# Patient Record
Sex: Male | Born: 1950 | Race: White
Health system: Northeastern US, Academic
[De-identification: ages and names within clinical notes are randomized; demographics above are authoritative.]

---

## 2017-10-20 ENCOUNTER — Ambulatory Visit
Admission: AD | Admit: 2017-10-20 | Discharge: 2017-10-20 | Disposition: A | Discharge status: Home / Self Care | Payer: Medicare (Managed Care) | Source: Ambulatory Visit | Attending: Internal Medicine | Admitting: Internal Medicine

## 2017-10-20 DIAGNOSIS — J208 Acute bronchitis due to other specified organisms: Secondary | ICD-10-CM

## 2017-10-20 MED ORDER — GUAIFENESIN-CODEINE 100-10 MG/5ML PO SYRP *I*
5.0000 mL | ORAL_SOLUTION | Freq: Three times a day (TID) | ORAL | 0 refills | Status: AC | PRN
Start: 2017-10-20 — End: ?

## 2017-10-20 NOTE — ED Triage Notes (Signed)
Pt states he has had a cough for four days and the cough last night worsened. He states he was having coughing fits last night and his wife was worried that he could not catch his breath. He states that the cough is productive for green sputum. His doctor called him in a script for benzonatate however he is not experienced any relief from it        Triage Note   Martin JerseyKelly Veretta Sabourin, RN

## 2017-10-20 NOTE — Discharge Instructions (Signed)
Use over the counter Robitussin DM (or generic) for cough during the daytime.    Do not drive after taking codeine cough syrup

## 2017-10-20 NOTE — UC Provider Note (Signed)
History     Chief Complaint   Patient presents with    Cough     Pt states he has had a cough for four days and the cough last night worsened. He states he was having coughing fits last night and his wife was worried that he could not catch his breath. He states that the cough is productive for green sputum. His doctor called him in a script for benzonatate however he is not experienced any relief from it      66 yo M with cough; about 4 days, no other symptoms. Has some SOB only with cough. Keeping him up at night. rx for tessalon but took twice with no improvement. Took mucinex which allowed him to bring up some green mucus. No congestion. No lung problems or inhalers. Nonsmoker. No f/c or chest pain. No other acute meds. Does take lisin 5 for HTN        History provided by:  Patient      Medical/Surgical/Family History     History reviewed. No pertinent past medical history.     There is no problem list on file for this patient.           History reviewed. No pertinent surgical history.  History reviewed. No pertinent family history.       Social History   Substance Use Topics    Smoking status: Never Smoker    Smokeless tobacco: Never Used    Alcohol use Not on file     Living Situation     Questions Responses    Patient lives with Spouse    Homeless     Caregiver for other family member     External Services     Employment Employed    Domestic Violence Risk                 Review of Systems   Review of Systems   Constitutional: Negative for appetite change, chills, fatigue and fever.   HENT: Negative for congestion, ear pain and sore throat.    Respiratory: Positive for cough and shortness of breath. Negative for chest tightness and wheezing.    Cardiovascular: Negative for chest pain.   Gastrointestinal: Negative for abdominal pain, diarrhea, nausea and vomiting.   Skin: Negative for rash.   Neurological: Negative for dizziness, weakness, numbness and headaches.   Psychiatric/Behavioral: Negative for  agitation and confusion.       Physical Exam   Triage Vitals  Triage Start: Start, (10/20/17 0915)   First Recorded BP: 154/74, Resp: 20, Temp: 36.7 C (98.1 F), Temp src: TEMPORAL Oxygen Therapy SpO2: 97 %, O2 Device: None (Room air), Heart Rate: 69, (10/20/17 0917)  .  First Pain Reported  0-10 Scale: 0, (10/20/17 0919)       Physical Exam   Constitutional: He is oriented to person, place, and time. He appears well-developed and well-nourished. No distress.   HENT:   Head: Normocephalic and atraumatic.   Right Ear: Tympanic membrane, external ear and ear canal normal.   Left Ear: Tympanic membrane, external ear and ear canal normal.   Nose: Right sinus exhibits no maxillary sinus tenderness and no frontal sinus tenderness. Left sinus exhibits no maxillary sinus tenderness and no frontal sinus tenderness.   Mouth/Throat: Normal dentition. No uvula swelling. No oropharyngeal exudate, posterior oropharyngeal edema or posterior oropharyngeal erythema. Tonsils are 1+ on the right. Tonsils are 1+ on the left. No tonsillar exudate.   Eyes: Pupils are equal,  round, and reactive to light. Conjunctivae and EOM are normal.   Neck: Normal range of motion. Neck supple.   Cardiovascular: Normal rate, regular rhythm and normal heart sounds.  Exam reveals no gallop and no friction rub.    No murmur heard.  Pulmonary/Chest: Effort normal and breath sounds normal. No respiratory distress. He has no wheezes. He has no rales.   Abdominal: Soft. He exhibits no distension. There is no tenderness. There is no rebound and no guarding.   Neurological: He is alert and oriented to person, place, and time. No cranial nerve deficit. Coordination normal.   Skin: Skin is warm and dry. No rash noted.   Psychiatric: He has a normal mood and affect. His behavior is normal.        Medical Decision Making        Initial Evaluation:  ED First Provider Contact     Date/Time Event User Comments    10/20/17 0915 ED First Provider Contact Dorathy Stallone  Initial Face to Face Provider Contact          Patient was seen on: 10/20/2017        Assessment:  66 y.o.male [facility] with cough, viral URI    Differential Diagnosis includes:  Acute viral bronchitis  Community acquired pneumonia  Acute URI NOS  Acute pneumonitis  Pleurisy      Plan: likely viral. Normal lung sounds and O2. No underlying pulm disease. Will treat w codeine guaituss only at night; during day can use robitussin DM.          Final Diagnosis  Final diagnoses:   [J20.8] Viral bronchitis (Primary)     Orders Placed This Encounter    guaiFENesin-codeine (GUAITUSS AC) 100-10 MG/5ML liquid       No results found for this or any previous visit (from the past 24 hour(s)).      Final Diagnosis    ICD-10-CM ICD-9-CM   1. Viral bronchitis J20.8 466.0       Encourage fluids, encourage rest, good hand hygiene.    Use over the counter medications as discussed.    Please start the new medications as below:    Current Discharge Medication List      New Medications    Details Last Dose Given Next Dose Due Script Given?   guaiFENesin-codeine (GUAITUSS AC) 5 mLs Dose: 5 mLs  Take 5 mLs by mouth 3 times daily as needed for Cough    Quantity 118 mL, Refill 0  Start date: 10/20/2017                   Please follow up with your physician as below:        Thank you Lala LundRonald Klindt for coming to [facility] for your health care concerns.    If your condition changes and/or worsens please follow up with her primary doctor and/or return to the urgent care center.    If short of breath, chest pains or any other concerns please report to the emergency room.    In the event of an Emergency dial 911.      Sigifredo Pignato Camillia HerterMarie Cherelle Midkiff, MD          Gavina Dildine, Kelby Alineawn Marie, MD  10/20/17 [phone]

## 2017-10-20 NOTE — ED Notes (Signed)
Discharge instructions reviewed with pt. Pt verbalized understanding. Pt advised to pick up prescription at pharmacy. All belongings with, pt ambulatory at discharge.      Natayla Cadenhead, RN
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
## 2023-07-28 IMAGING — CT CT BRAIN WITHOUT CONTRAST
3 series · 15 of 47 positions shown, 18 images · non-contrast
Comparison: MRI brain from July 15, 2023.

________________________________________________________________________________________________ 
CT BRAIN WITHOUT CONTRAST, 07/28/2023 [DATE]: 
CLINICAL INDICATION: Subdural hematoma. Follow-up. 
A search for DICOM formatted images was conducted for prior CT imaging studies 
completed at a non-affiliated media free facility.
TECHNIQUE: The head was scanned from vertex through skull base without contrast 
on a high resolution CT scanner using dose reduction techniques. Routine MPR 
reconstructions were performed.

[Series 2: axial · axial · 0.41mm/px · z∈[-187,-46]mm · 9 of 55 slices shown, 12 images]
[im 4/55  brain]
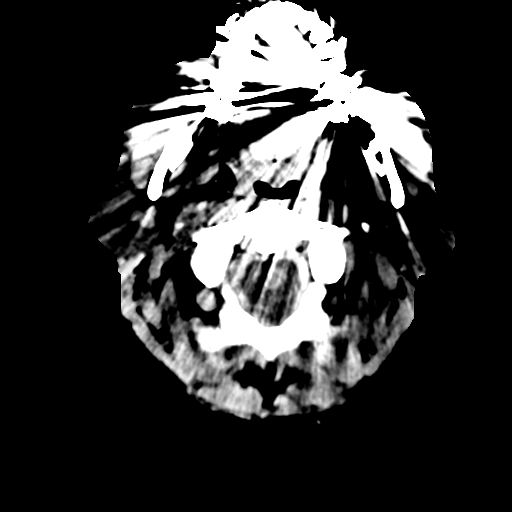
[im 4/55  bone]
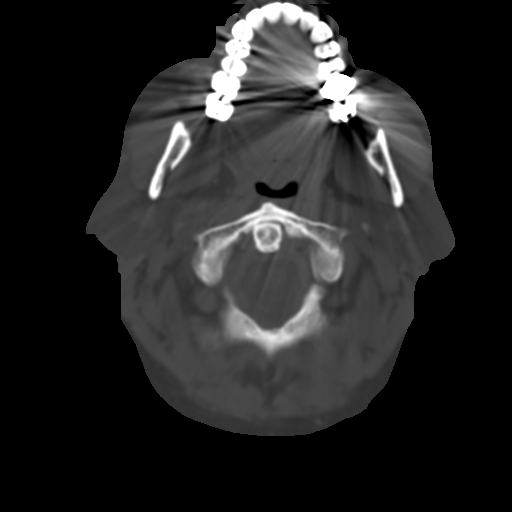
[im 10/55  brain]
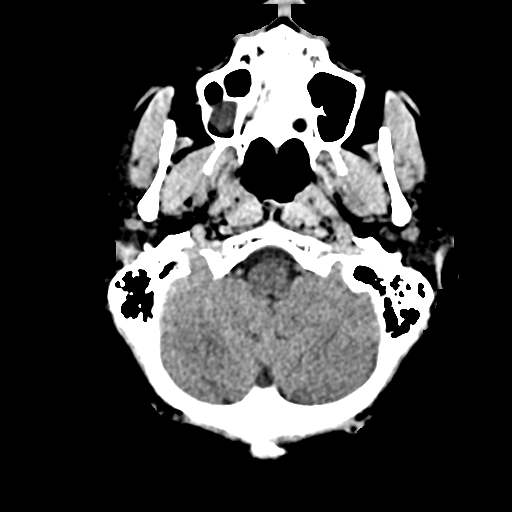
[im 15/55  brain]
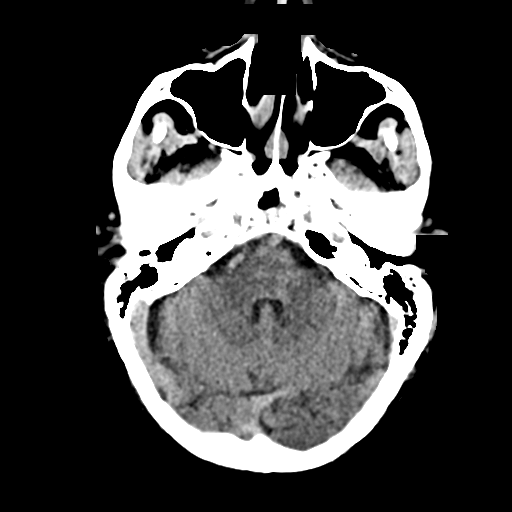
[im 21/55  brain]
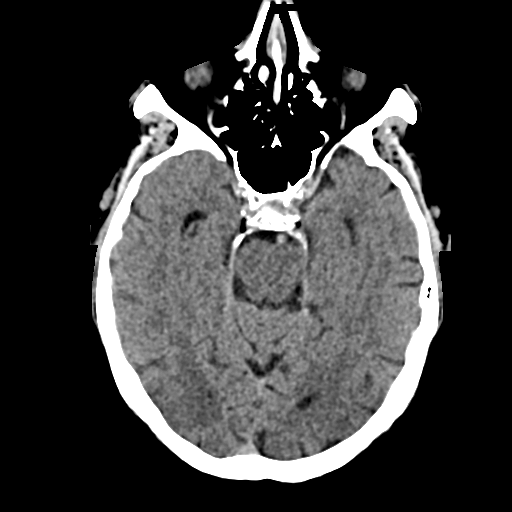
[im 28/55  brain]
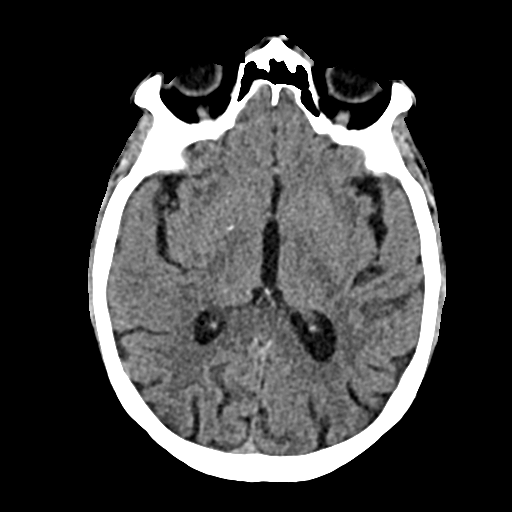
[im 28/55  bone]
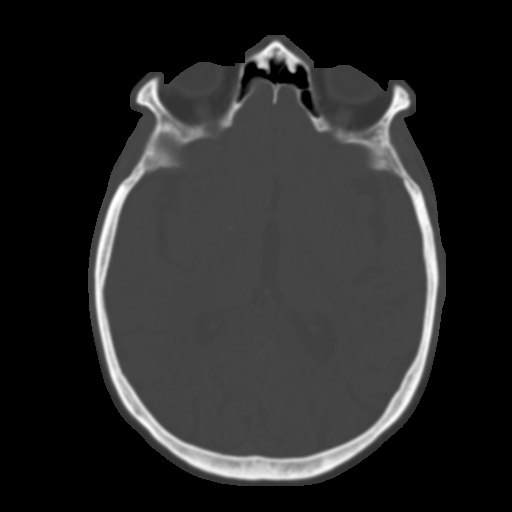
[im 34/55  brain]
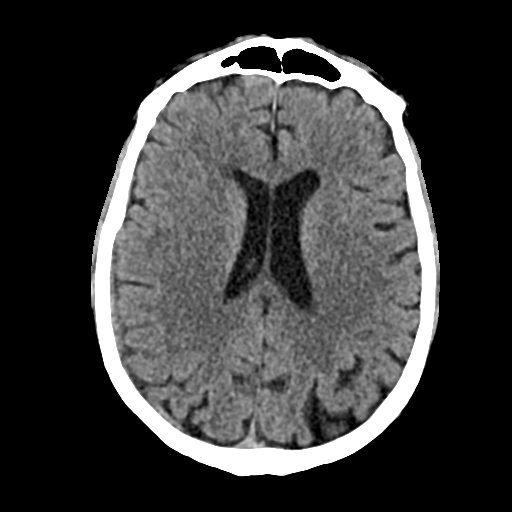
[im 40/55  brain]
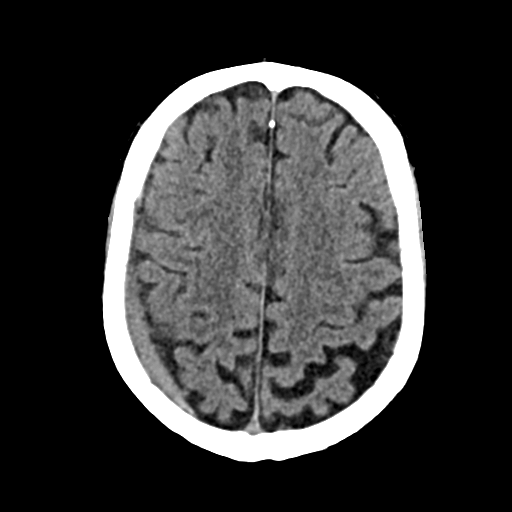
[im 45/55  brain]
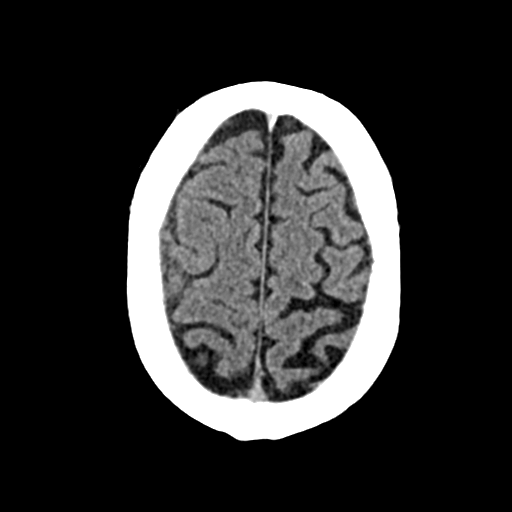
[im 51/55  brain]
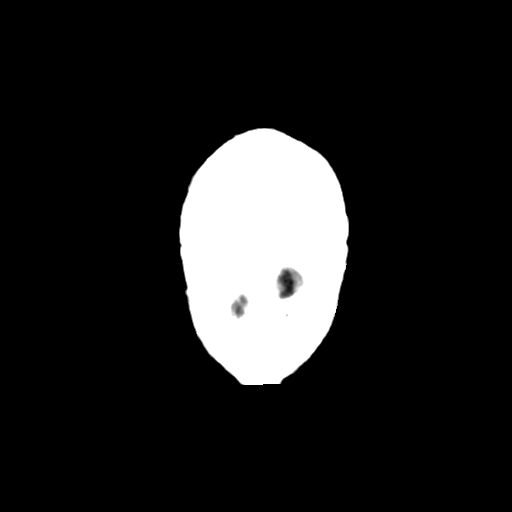
[im 51/55  bone]
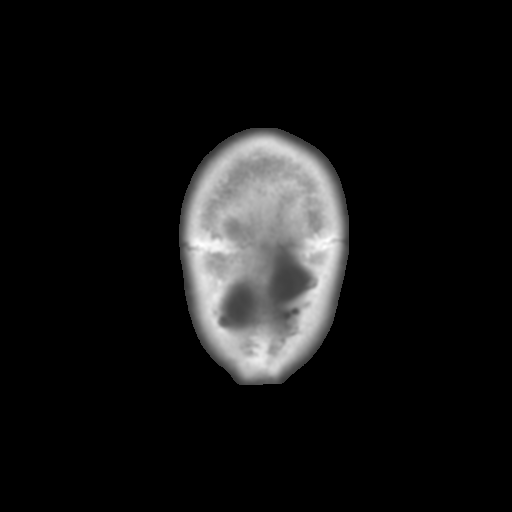

[Series 4: cor · coronal · 0.34mm/px · 3 of 105 slices shown]
[im 35/105  brain]
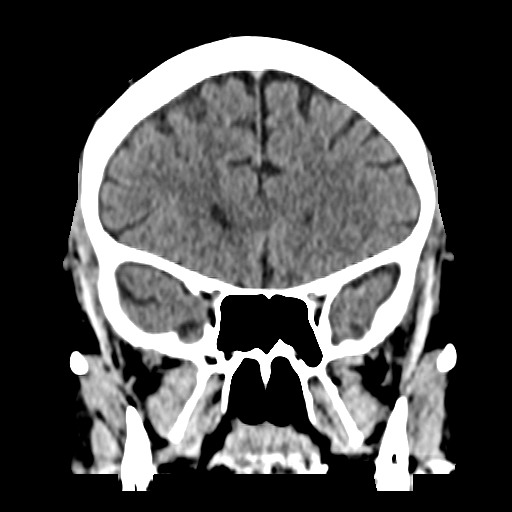
[im 47/105  brain]
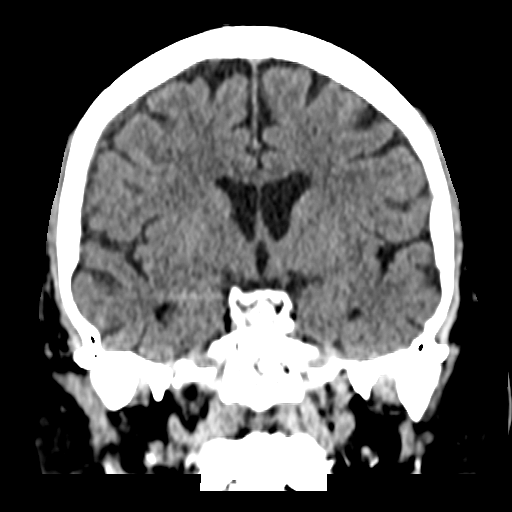
[im 58/105  brain]
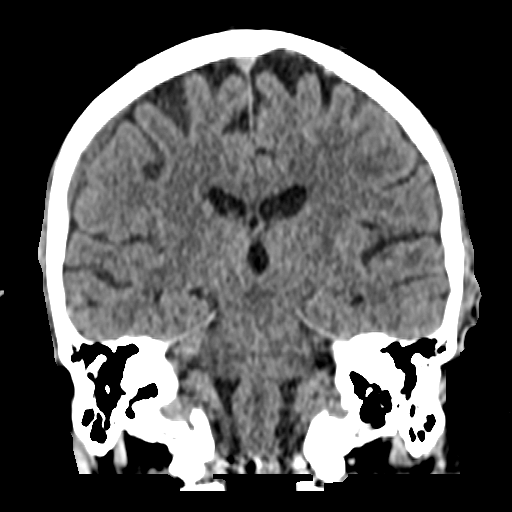

[Series 5: sag · sagittal · 0.33mm/px · 3 of 67 slices shown]
[im 23/67  brain]
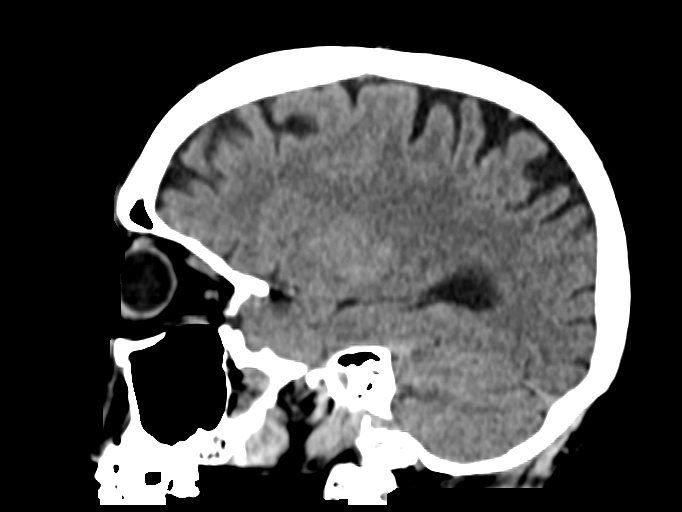
[im 34/67  brain]
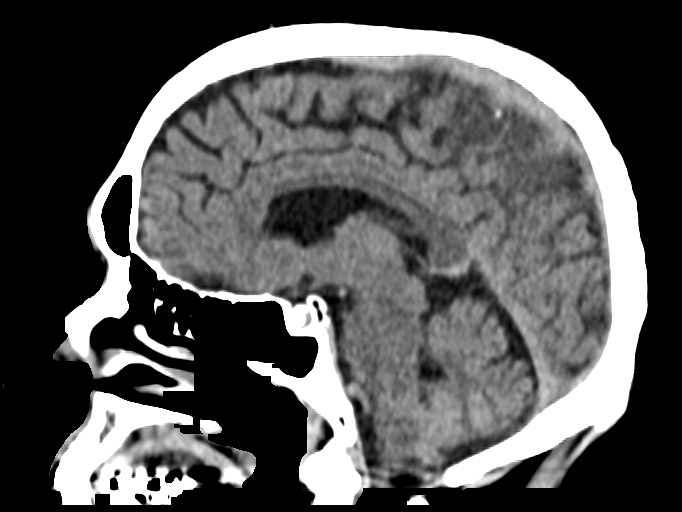
[im 45/67  brain]
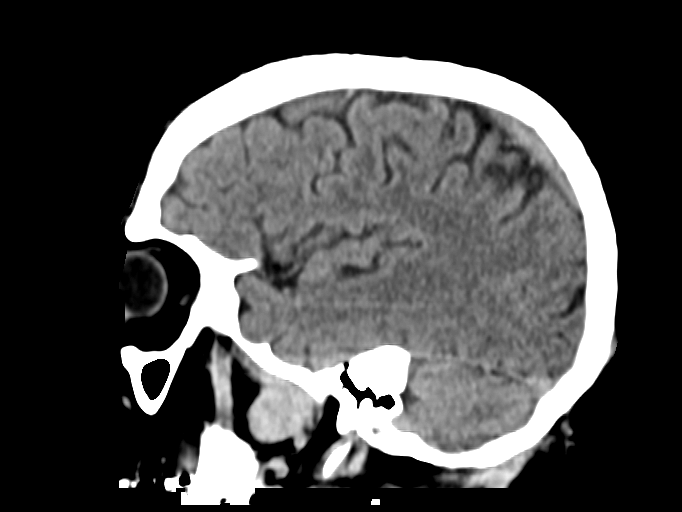

[15 of 47 positions shown; findings below may reference images not displayed]

Count of known CT and Cardiac Nuclear Medicine studies performed in the previous 
12 months = 0.
FINDINGS: -------------------------------------------------------------------------------- 
------------------------- 
INTRACRANIAL: 
Subdural hematoma along the right parietal convexity which measures up to 0.8 cm 
in thickness as measured on coronal reconstruction. This hematoma is isodense to 
hyperdense consistent with late acute to subacute age. No significant change 
compared to recent MRI allowing for differences in modality. No midline shift. 
No hydrocephalus. No acute ischemia. 
-------------------------------------------------------------------------------- 
----------------------- 
OTHER: 
ORBITS/SINUSES/T-BONES:  Visualized orbits show no acute abnormality or mass.  
Mastoid air cells and middle ear cavities are clear.  Right maxillary sinus 
retention cyst inferiorly. 
BONES/SOFT TISSUES: No acute osseous abnormality. 
-------------------------------------------------------------------------------- 
-------------------
IMPRESSION: Stable right subdural hematoma as above. Continued imaging follow-up is advised. 
RADIATION DOSE REDUCTION: All CT scans are performed using radiation dose 
reduction techniques, when applicable.  Technical factors are evaluated and 
adjusted to ensure appropriate moderation of exposure.  Automated dose 
management technology is applied to adjust the radiation doses to minimize 
exposure while achieving diagnostic quality images.

## 2023-10-06 IMAGING — CT CT BRAIN WITHOUT CONTRAST
3 series · 15 of 47 positions shown, 18 images · non-contrast
Comparison: CT brain from July 28, 2023.

________________________________________________________________________________________________ 
CT BRAIN WITHOUT CONTRAST, 10/06/2023 [DATE]: 
CLINICAL INDICATION: Subdural hemorrhage. 
A search for DICOM formatted images was conducted for prior CT imaging studies 
completed at a non-affiliated media free facility.
TECHNIQUE: The head was scanned from vertex through skull base without contrast 
on a high resolution CT scanner using dose reduction techniques. Routine MPR 
reconstructions were performed.

[Series 2: head 3.0 j30s 1 · axial · 0.44mm/px · z∈[+1143,+1284]mm · 9 of 55 slices shown, 12 images]
[im 4/55  brain]
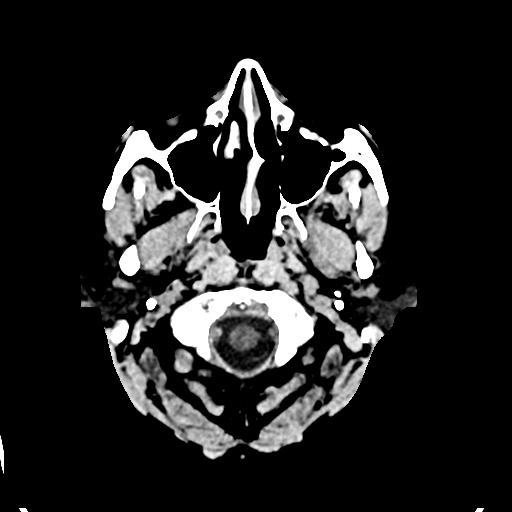
[im 4/55  bone]
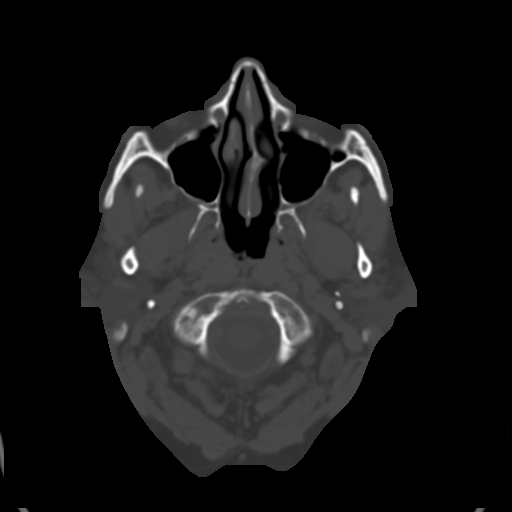
[im 10/55  brain]
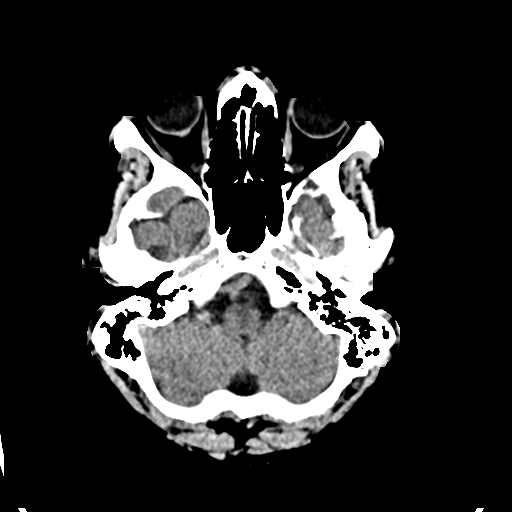
[im 15/55  brain]
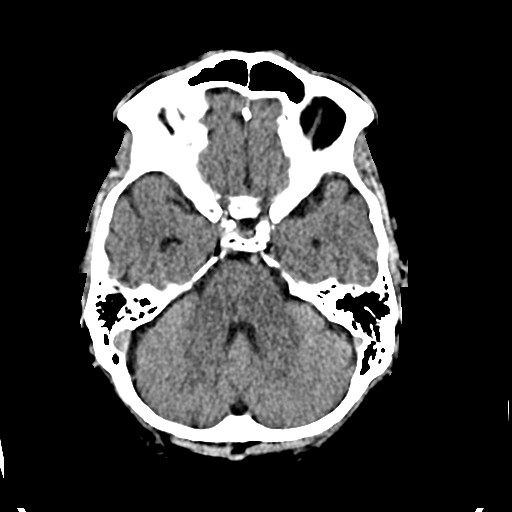
[im 21/55  brain]
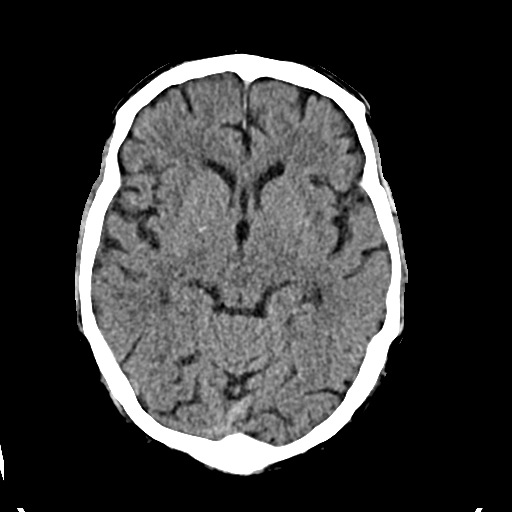
[im 28/55  brain]
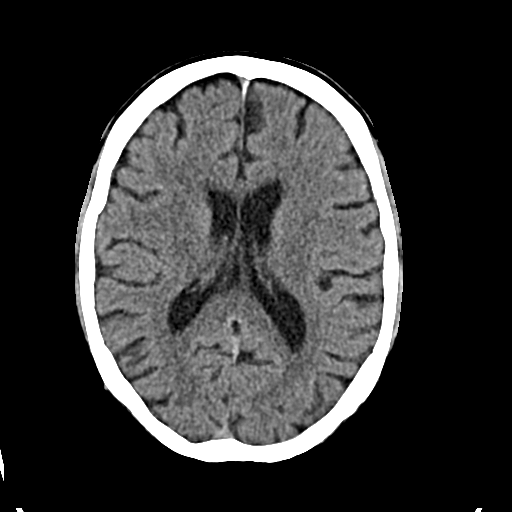
[im 28/55  bone]
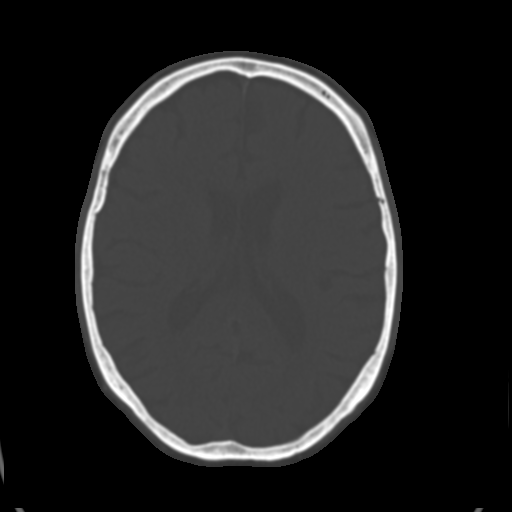
[im 34/55  brain]
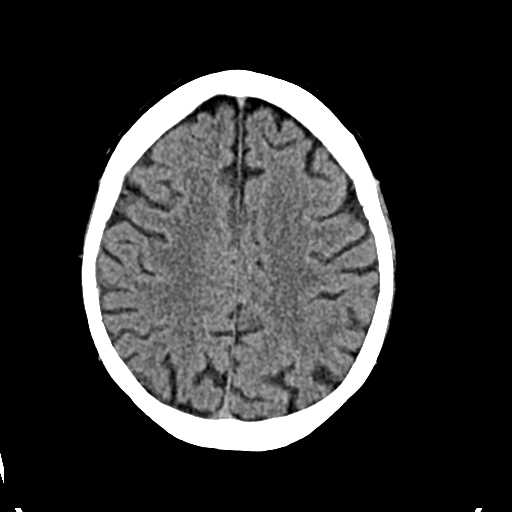
[im 40/55  brain]
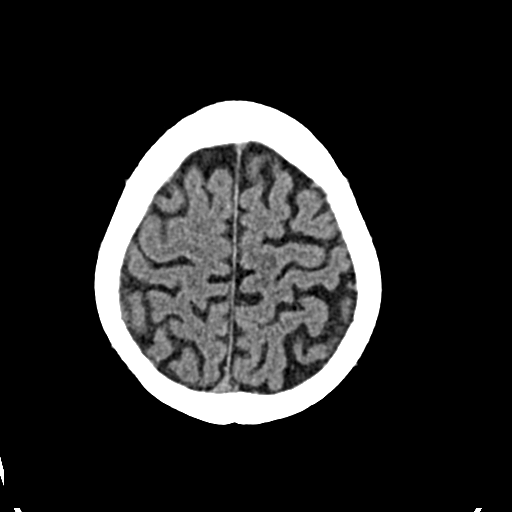
[im 45/55  brain]
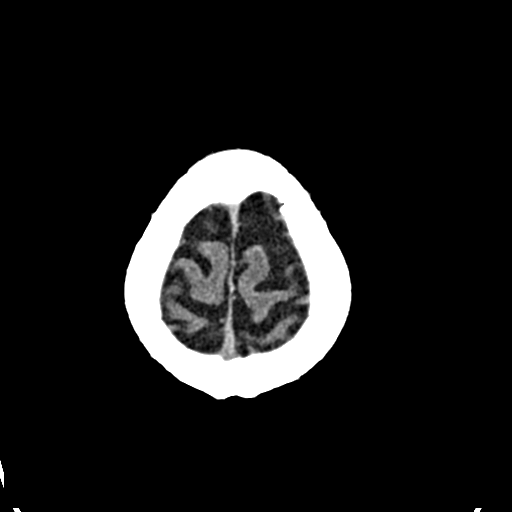
[im 51/55  brain]
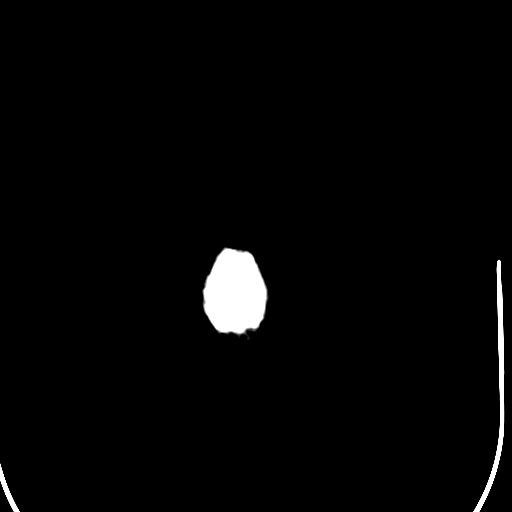
[im 51/55  bone]
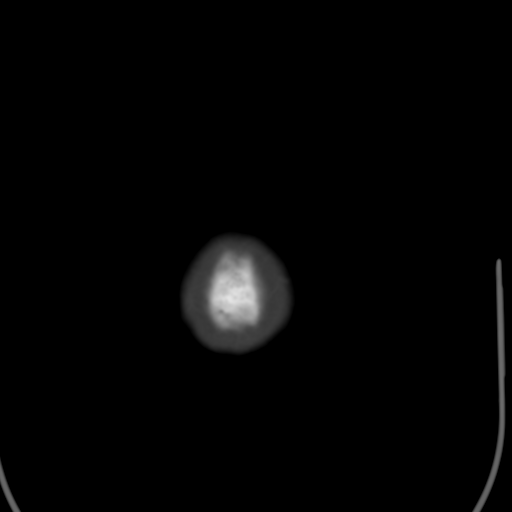

[Series 4: coronal · coronal · 0.35mm/px · 3 of 71 slices shown]
[im 24/71  brain]
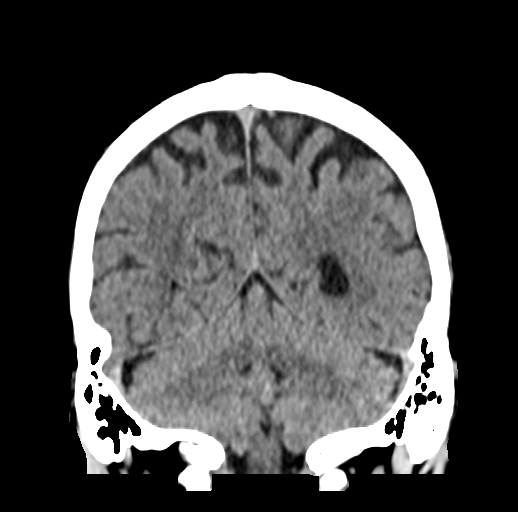
[im 32/71  brain]
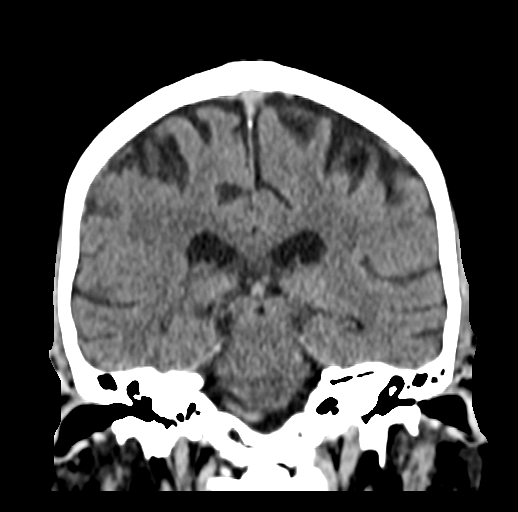
[im 39/71  brain]
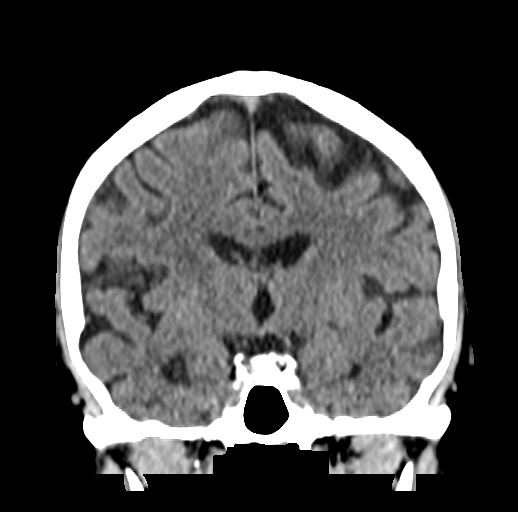

[Series 5: sagittal · sagittal · 0.34mm/px · 3 of 59 slices shown]
[im 20/59  brain]
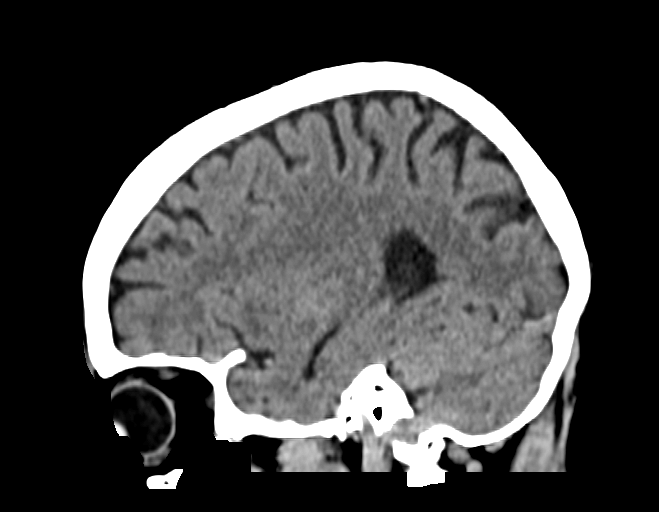
[im 30/59  brain]
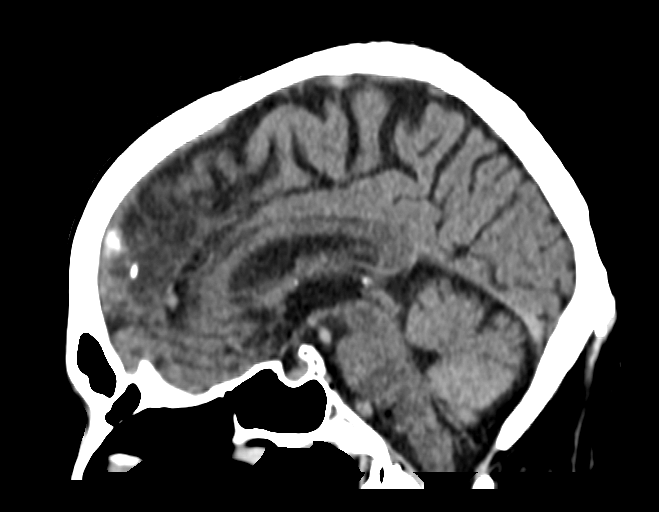
[im 39/59  brain]
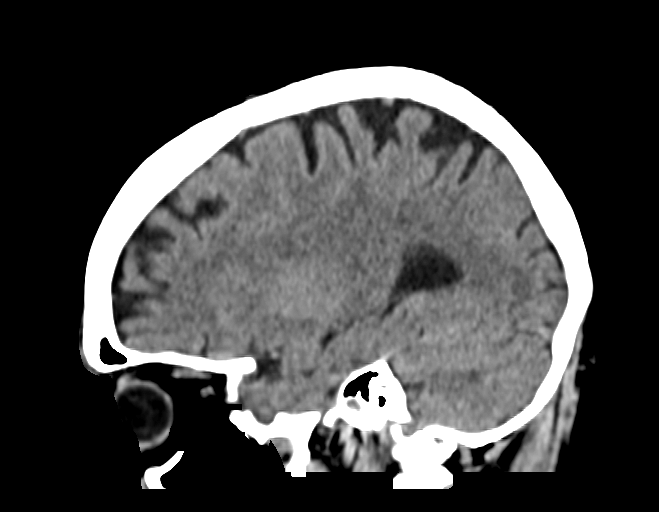

[15 of 47 positions shown; findings below may reference images not displayed]

Count of known CT and Cardiac Nuclear Medicine studies performed in the previous 
12 months = 1.
FINDINGS: -------------------------------------------------------------------------------- 
------------------------- 
INTRACRANIAL: 
2 mm right cerebral convexity residual isodense subdural hematoma, markedly 
improved from prior examination. On the prior examination this measured 8 mm. No 
significant mass effect upon the right cerebrum. No new hemorrhage. No acute 
ischemia. No hydrocephalus. 
-------------------------------------------------------------------------------- 
----------------------- 
OTHER: 
ORBITS/SINUSES/T-BONES:  Visualized orbits show no acute abnormality or mass.  
Mastoid air cells and middle ear cavities are clear.  Visualized paranasal 
sinuses are clear. 
BONES/SOFT TISSUES: No acute osseous abnormality. 
-------------------------------------------------------------------------------- 
-------------------
IMPRESSION: Marked improvement in right cerebral convexity subdural hematoma with tiny 2 mm 
residual blood product remaining. 
RADIATION DOSE REDUCTION: All CT scans are performed using radiation dose 
reduction techniques, when applicable.  Technical factors are evaluated and 
adjusted to ensure appropriate moderation of exposure.  Automated dose 
management technology is applied to adjust the radiation doses to minimize 
exposure while achieving diagnostic quality images.

## 2023-10-21 IMAGING — DX LUMBAR SPINE COMPLETE 4 VIEWS
1 series · 4 of 4 positions shown · non-contrast
Comparison: None

________________________________________________________________________________________________ 
LUMBAR SPINE COMPLETE 4 VIEWS, 10/21/2023 [DATE]: 
CLINICAL INDICATION: Low Back Pain

[Series 1: AP · 0.14mm/px · 4 of 4 slices shown]
[im 1/4]
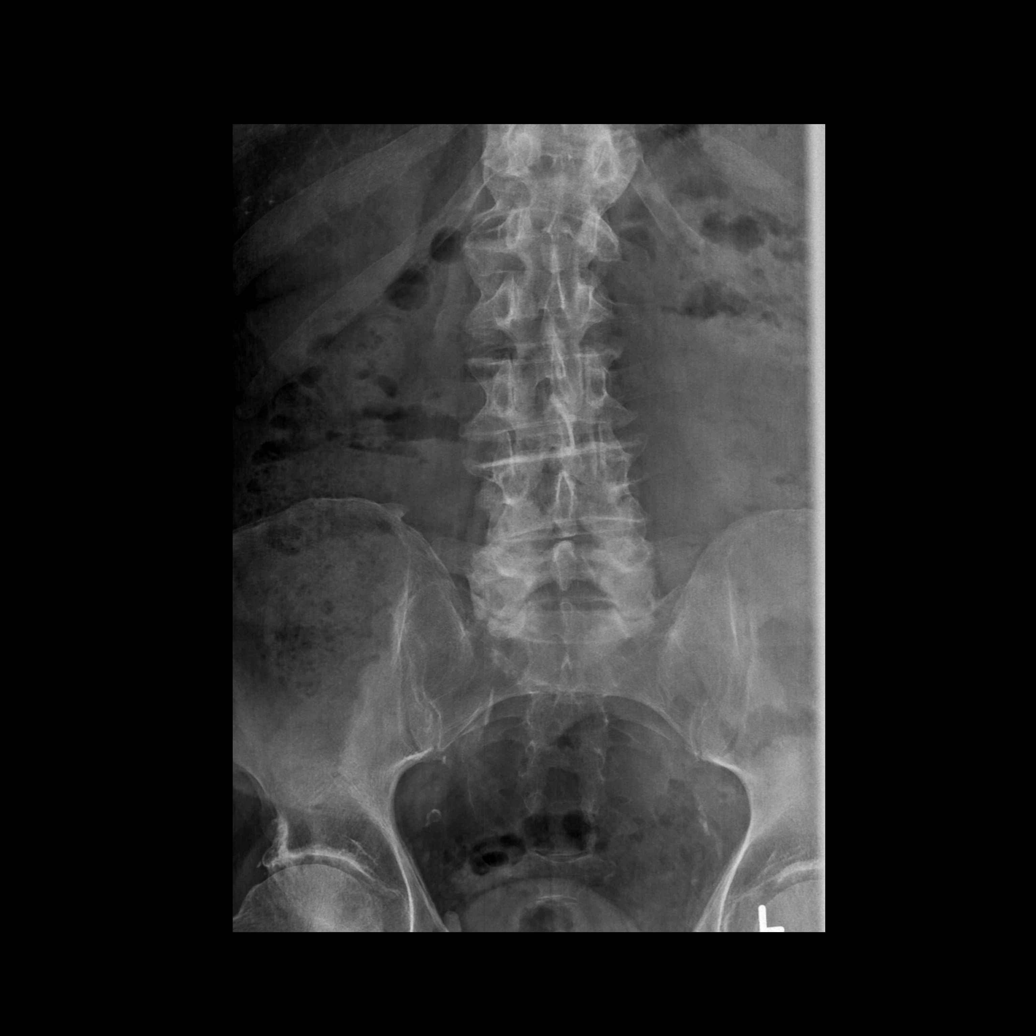
[im 2/4]
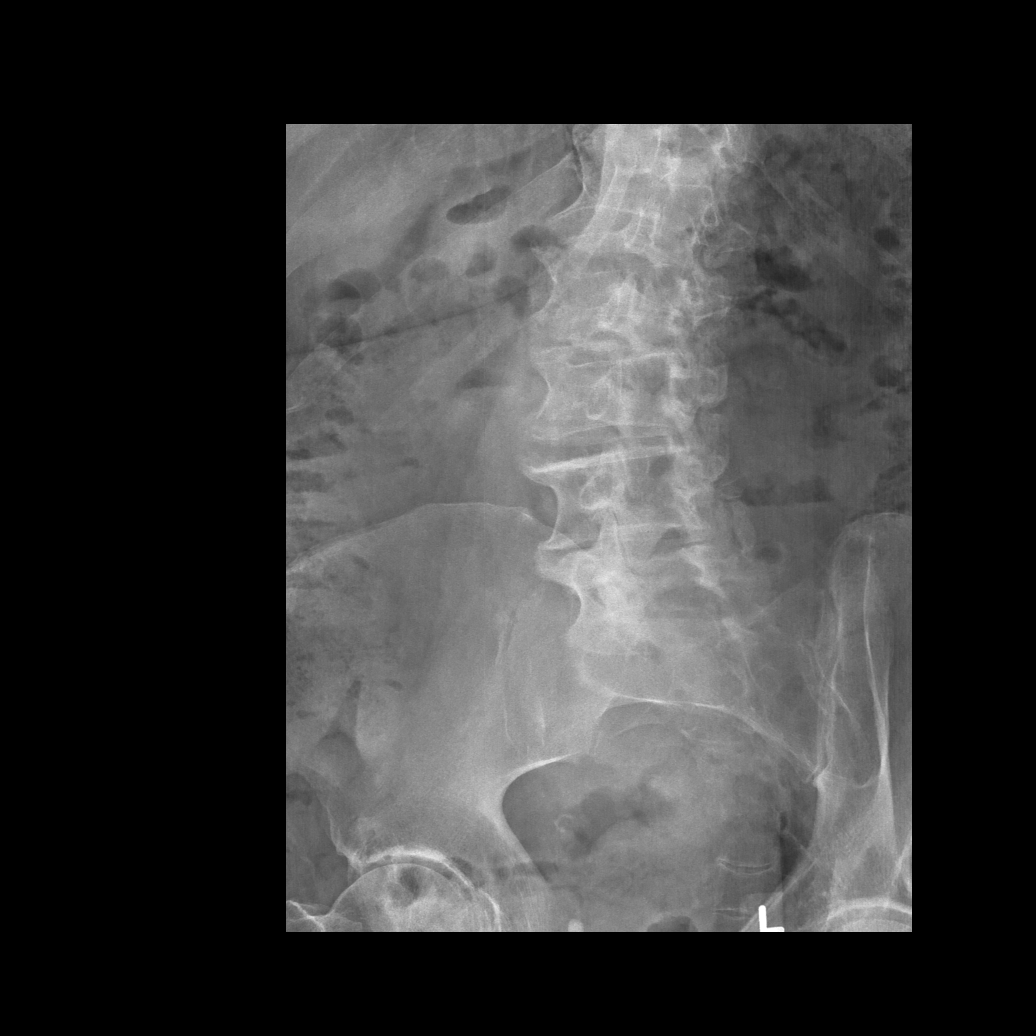
[im 3/4]
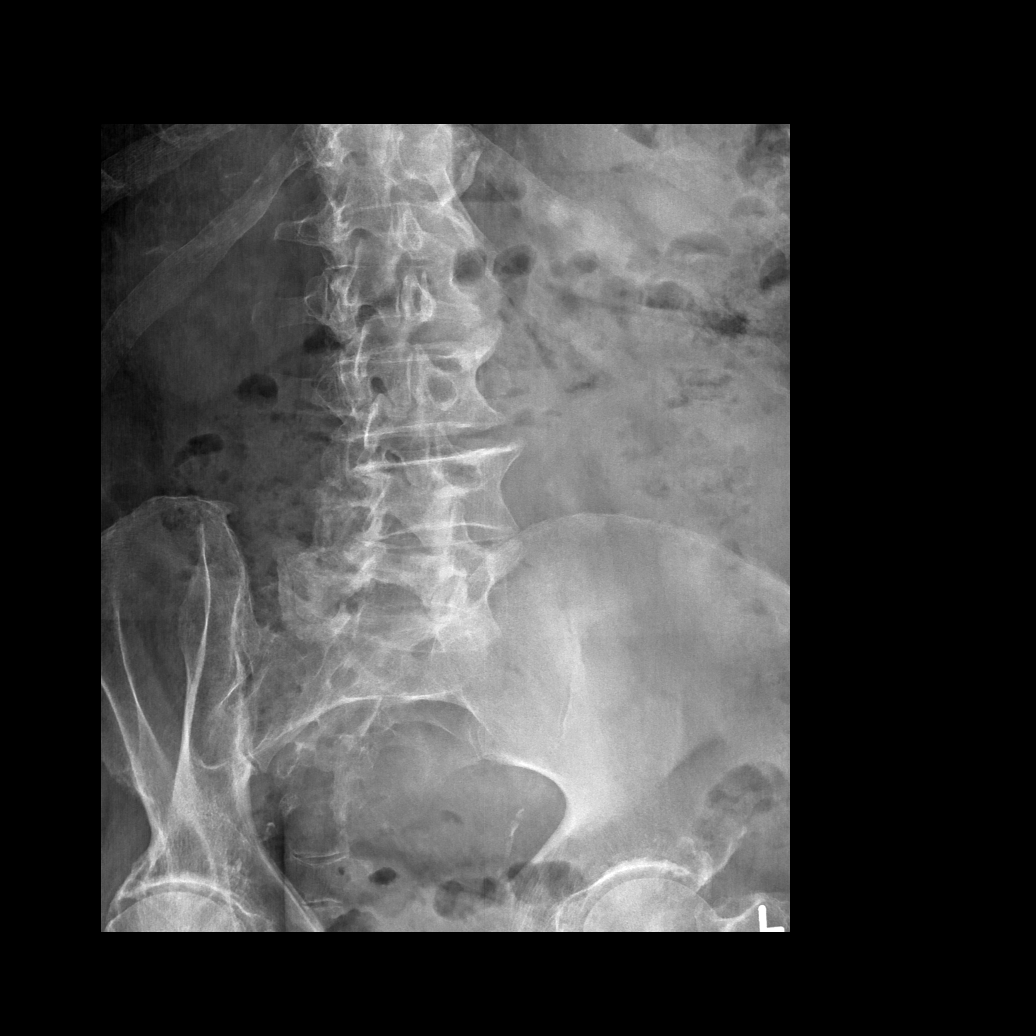
[im 4/4]
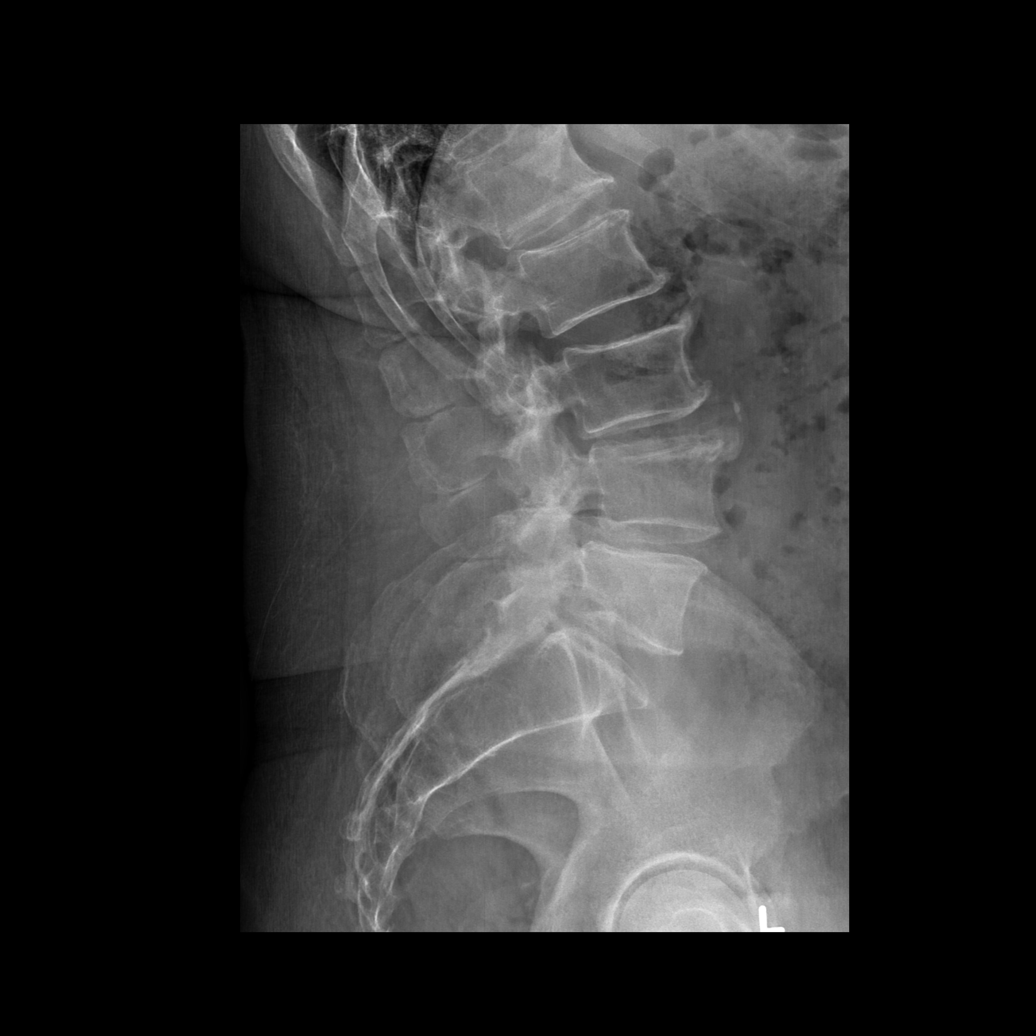

[4 of 4 positions shown; findings below may reference images not displayed]

FINDINGS: 5 lumbar type vertebral bodies. Mild dextroconvex thoracic lumbar 
scoliosis. Grade 1 anterolisthesis L4 on L5 is degenerative. Otherwise there is 
anatomic sagittal alignment. Vertebral body height preserved without fracture. 
Multilevel DDD changes most significant at T12-L1. Osteopenia. Facet 
arthropathy. Moderate right hip degenerative changes. Mild bilateral SI joint 
degenerative changes. Pelvic phleboliths.
IMPRESSION: Degenerative and scoliotic changes. Consider correlation with MRI. 
Osteopenia. Advise further assessment with DEXA with trabecular bone score, if 
not already performed.
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
